# Patient Record
Sex: Female | Born: 1955 | Race: White | Hispanic: No | Smoking: Former smoker
Health system: Southern US, Community
[De-identification: ages and names within clinical notes are randomized; demographics above are authoritative.]

## PROBLEM LIST (undated history)

## (undated) DIAGNOSIS — T7840XA Allergy, unspecified, initial encounter: Secondary | ICD-10-CM

## (undated) DIAGNOSIS — F17200 Nicotine dependence, unspecified, uncomplicated: Secondary | ICD-10-CM

## (undated) DIAGNOSIS — M545 Low back pain, unspecified: Secondary | ICD-10-CM

## (undated) DIAGNOSIS — C801 Malignant (primary) neoplasm, unspecified: Secondary | ICD-10-CM

## (undated) DIAGNOSIS — J309 Allergic rhinitis, unspecified: Secondary | ICD-10-CM

---
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
## 2018-10-24 ENCOUNTER — Other Ambulatory Visit: Payer: Self-pay | Admitting: Radiation Oncology

## 2018-11-06 ENCOUNTER — Telehealth: Payer: Self-pay | Admitting: *Deleted

## 2018-11-06 NOTE — Telephone Encounter (Addendum)
Oncology Nurse Navigator Documentation  Received call from patient's wife in follow-up to call from [place] earlier today.  She works at [employer]. She indicated:  She will bring disc with recent imaging to [facility] tomorrow afternoon.  If PEG needed, he will have placement through [place].  Husband has dental policy that should cover pre-radiotherapy evaluation and related procedures. I explained appt with Dr. Isidore Moos to be scheduled s/p PET for which appt pending. I encouraged her to call me with additional questions.   Gayleen Orem, RN, BSN Head & Neck Oncology Nurse [place] at [place] [phone]

## 2018-11-06 NOTE — Telephone Encounter (Addendum)
Oncology Nurse Navigator Documentation  Rec'd call from Blanchard Kelch, Case Manager, [facility] [phone] x [phone]). 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  Imaging conducted 7/24 CT Neck, 7/31 Chest Xray.  Authorization for services to include PET.  Bx 8/5 [facility], pathologist Dr. Deliah Boston [phone] x [phone]), e-mail  [email or web address].  She is faxing to my attention reports for imaging, bx.  With pt's permission, she is providing my contact info to pt wife who is going to call me.  Gayleen Orem, RN, BSN Head & Neck Oncology Nurse [place] [facility] at [place] [phone]

## 2018-11-07 ENCOUNTER — Encounter: Payer: Self-pay | Admitting: *Deleted

## 2018-11-07 ENCOUNTER — Other Ambulatory Visit: Payer: Self-pay | Admitting: *Deleted

## 2018-11-07 DIAGNOSIS — C329 Malignant neoplasm of larynx, unspecified: Secondary | ICD-10-CM

## 2018-11-07 NOTE — Progress Notes (Signed)
Oncology Nurse Navigator Documentation  Met with pt's wife [facility] lobby, received disc with recent imaging.  Provided information for husband's PET scheduled 8/26, arrival to [facility] 0645, reviewed NPO/low carb guidelines.  She voiced understanding.  Gayleen Orem, RN, BSN Head & Neck Oncology Nurse [place] at [place] [phone]

## 2018-11-11 ENCOUNTER — Ambulatory Visit
Admission: RE | Admit: 2018-11-11 | Discharge: 2018-11-11 | Disposition: A | Discharge status: Home / Self Care | Payer: Self-pay | Source: Ambulatory Visit | Attending: Radiation Oncology | Admitting: Radiation Oncology

## 2018-11-11 ENCOUNTER — Other Ambulatory Visit (HOSPITAL_COMMUNITY): Payer: Self-pay | Admitting: Radiation Oncology

## 2018-11-11 DIAGNOSIS — C801 Malignant (primary) neoplasm, unspecified: Secondary | ICD-10-CM

## 2018-11-12 ENCOUNTER — Ambulatory Visit: Payer: Non-veteran care | Admitting: Radiation Oncology

## 2018-11-13 ENCOUNTER — Encounter: Payer: Self-pay | Admitting: Radiation Oncology

## 2018-11-13 ENCOUNTER — Other Ambulatory Visit: Payer: Self-pay

## 2018-11-13 ENCOUNTER — Telehealth: Payer: Self-pay | Admitting: *Deleted

## 2018-11-13 ENCOUNTER — Encounter (HOSPITAL_COMMUNITY)
Admission: RE | Admit: 2018-11-13 | Discharge: 2018-11-13 | Disposition: A | Discharge status: Home / Self Care | Payer: No Typology Code available for payment source | Source: Ambulatory Visit | Attending: Radiation Oncology | Admitting: Radiation Oncology

## 2018-11-13 DIAGNOSIS — C329 Malignant neoplasm of larynx, unspecified: Secondary | ICD-10-CM | POA: Insufficient documentation

## 2018-11-13 LAB — GLUCOSE, CAPILLARY: Glucose-Capillary: 95 mg/dL (ref 70–99)

## 2018-11-13 MED ORDER — FLUDEOXYGLUCOSE F - 18 (FDG) INJECTION
7.2000 | Freq: Once | INTRAVENOUS | Status: AC | PRN
  Administered 2018-11-13: 07:00:00 7.2 via INTRAVENOUS

## 2018-11-13 NOTE — Progress Notes (Signed)
Head and Neck Cancer Location of Tumor / Histology:  10/23/18   Patient presented with symptoms of: Hoarseness for one year  Biopsies of left vocal cord, left anterior commissure mass, left anterior true vocal cord, left true vocal cord revealed: Invasive squamous cell carcinoma.   Nutrition Status Yes No Comments  Weight changes? []  [x]    Swallowing concerns? []  [x]    PEG? []  [x]     Referrals Yes No Comments  Social Work? []  [x]    Dentistry? [x]  [x]    Swallowing therapy? []  [x]    Nutrition? []  [x]    Med/Onc? []  [x]     Safety Issues Yes No Comments  Prior radiation? []  [x]    Pacemaker/ICD? []  [x]    Possible current pregnancy? []  [x]    Is the patient on methotrexate? []  [x]     Tobacco/Marijuana/Snuff/ETOH use: He has a history of cigarette and chewing tobacco use.    Past/Anticipated interventions by otolaryngology, if any:  [facility]  Past/Anticipated interventions by medical oncology, if any:  None   Current Complaints / other details:   11/13/18 PET

## 2018-11-15 ENCOUNTER — Telehealth: Payer: Self-pay | Admitting: Radiation Oncology

## 2018-11-18 ENCOUNTER — Other Ambulatory Visit: Payer: Self-pay

## 2018-11-18 ENCOUNTER — Encounter: Payer: Self-pay | Admitting: Radiation Oncology

## 2018-11-18 ENCOUNTER — Ambulatory Visit
Admission: RE | Admit: 2018-11-18 | Discharge: 2018-11-18 | Disposition: A | Discharge status: Home / Self Care | Payer: Non-veteran care | Source: Ambulatory Visit | Attending: Radiation Oncology | Admitting: Radiation Oncology

## 2018-11-18 DIAGNOSIS — C320 Malignant neoplasm of glottis: Secondary | ICD-10-CM

## 2018-11-18 HISTORY — DX: Allergic rhinitis, unspecified: J30.9

## 2018-11-18 HISTORY — DX: Allergy, unspecified, initial encounter: T78.40XA

## 2018-11-18 HISTORY — DX: Low back pain, unspecified: M54.50

## 2018-11-18 HISTORY — DX: Malignant (primary) neoplasm, unspecified: C80.1

## 2018-11-18 HISTORY — DX: Nicotine dependence, unspecified, uncomplicated: F17.200

## 2018-11-18 NOTE — Progress Notes (Signed)
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 adult who presented with hoarseness.  Subsequently, the patient saw Dr. Silvestre Moment who appreciated a left glottic lesion that involves the anterior commissure and ventricle, clinical stage T2 N0.  I have personally discussed this case with Dr. Edison Nasuti to confirm her impression, laryngoscopy observations, and stage.  Biopsy of that lesion revealed invasive squamous cell carcinoma, well to moderately differentiated.  Pertinent imaging thus far includes CT scan of neck revealing a 7 mm right upper lobe pulmonary nodule that was indeterminate, neck was unremarkable.  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   Nutrition Status Yes No Comments  Weight changes? []  [x]    Swallowing concerns? []  [x]    PEG? []  [x]     Referrals Yes No Comments  Social Work? []  [x]    Dentistry? [x]  [x]    Swallowing therapy? []  [x]    Nutrition? []  [x]    Med/Onc? []  [x]      Safety Issues Yes No Comments  Prior radiation? []  [x]    Pacemaker/ICD? []  [x]    Possible current pregnancy? []  [x]    Is the patient on methotrexate? []  [x]     Tobacco/Marijuana/Snuff/ETOH use: He has a history of cigarette and chewing tobacco use.  He quit after diagnosis and is using nicotine patches.  Past/Anticipated interventions by otolaryngology, if any:  [facility]  Past/Anticipated interventions by medical oncology, if any:  None    PREVIOUS RADIATION THERAPY: No  PAST MEDICAL HISTORY:  has a past medical history of Allergic rhinitis, Allergy, Cancer ([place]), Low back pain, and Nicotine dependence.    PAST SURGICAL HISTORY:History reviewed. No pertinent surgical history.  FAMILY HISTORY: family history is not on file.  SOCIAL HISTORY:  reports that he has quit smoking. He quit smokeless tobacco use about 8 months ago. He reports current alcohol use of about 14.0 standard drinks of alcohol per week. He reports that he does not use drugs.  ALLERGIES: Patient has no known allergies.  MEDICATIONS:  Current Outpatient Medications  Medication Sig Dispense Refill  . cetirizine (ZYRTEC) 10 MG tablet Take 10 mg by mouth daily.    . fluticasone (FLONASE) 50 MCG/ACT nasal spray Place into both nostrils daily.    . nicotine (NICODERM CQ - DOSED IN MG/24 HOURS) 14 mg/24hr patch Place 14 mg onto the skin daily.    Marland Kitchen omeprazole (PRILOSEC) 40 MG capsule Take 40 mg by mouth daily.    . nicotine (NICODERM CQ - DOSED  IN MG/24 HOURS) 21 mg/24hr patch Place 21 mg onto the skin daily.    . nicotine (NICODERM CQ - DOSED IN MG/24 HR) 7 mg/24hr patch Place 7 mg onto the skin daily.     No current facility-administered medications for this encounter.     REVIEW OF SYSTEMS:  Notable for that above.   PHYSICAL EXAM:  vitals were not taken for this visit.   General: Alert and oriented, in no acute distress.   Psychiatric: Judgment and insight are intact. Affect is appropriate.     LABORATORY DATA:  No results found for: WBC, HGB, HCT, MCV, PLT CMP  No results found for: NA, K, CL, CO2, GLUCOSE, BUN, CREATININE, CALCIUM, PROT, ALBUMIN, AST, ALT, ALKPHOS, BILITOT, GFRNONAA, GFRAA    No results found for: TSH   RADIOGRAPHY: Nm Pet Image Initial (pi) Skull Base To Thigh  Result Date: 11/13/2018 CLINICAL DATA:  Initial treatment strategy for residual cancer. EXAM: NUCLEAR MEDICINE PET SKULL BASE TO THIGH TECHNIQUE: 7.2 mCi F-18 FDG was injected intravenously. Full-ring PET imaging was performed from the skull base to thigh after the radiotracer. CT data was obtained and used for attenuation correction and anatomic localization. Fasting blood glucose: 95 mg/dl COMPARISON:  Outside CT neck 10/11/2018. FINDINGS: Mediastinal blood pool activity: SUV max 2.5 Liver activity: SUV max NA NECK: No hypermetabolic lymph nodes. Uptake within the oropharynx appears symmetric. Incidental CT findings: None. CHEST: No hypermetabolic mediastinal hilar or axillary nodes. No hypermetabolic pulmonary nodules. Incidental CT findings: Heart is mildly enlarged. No pericardial or pleural effusion. There may be distal esophageal wall thickening which can be seen with gastroesophageal reflux. ABDOMEN/PELVIS: No abnormal hypermetabolism in the liver, adrenal glands, spleen or pancreas. No hypermetabolic lymph nodes. Incidental CT findings: Liver may be slightly decreased in attenuation diffusely. A stone is seen in the gallbladder. Adrenal glands, kidneys, spleen, pancreas, stomach and bowel are unremarkable. No free fluid. SKELETON: No abnormal osseous hypermetabolism. Incidental CT findings: None. IMPRESSION: 1. No evidence of a hypermetabolic primary lesion or adenopathy in the neck. 2. Hepatic steatosis. 3. Cholelithiasis. Electronically Signed   By: Lorin Picket M.D.   On: 11/13/2018 09:06      IMPRESSION/PLAN:  This is a delightful patient with head and neck cancer.  He has discussed surgical options  with Dr. Edison Nasuti.  He would like alternatives.  Therefore we talked about definitive radiation therapy to the larynx today which would take place over 6 weeks.  We discussed the potential risks, benefits, and side effects of radiotherapy. We talked in detail about acute and late effects. We discussed that some of the most bothersome acute effects may be mucositis, salivary changes, hoarseness, skin irritation, hair loss, dehydration, weight loss and fatigue. We talked about late effects which include but are not necessarily limited to dysphagia, hypothyroidism, permanent injury to the larynx, permanent skin changes. No guarantees of treatment were given. The patient is enthusiastic about proceeding with treatment. I look forward to participating in the patient's care.    Simulation (treatment planning) will take place tomorrow  We also discussed that the treatment of head and neck cancer is a multidisciplinary process to maximize treatment outcomes and quality of life. For this reason the following referrals have been or will be made:  He has been set up with dentistry at the [facility].  I do not anticipate any significant dose to his mouth   Nutritionist for nutrition support during and after treatment.   Speech language pathology for swallowing  and/or speech therapy.   Social work for social support.    Baseline labs including TSH.  This encounter was provided by telemedicine platform phone as he was not able to access WebEx.  The patient has given verbal consent for this type of encounter and has been advised to only accept a meeting of this type in a secure network environment. The time spent during this encounter was 28 minutes. The attendants for this meeting include Eppie Gibson  and Festus Aloe.  His wife was present too. During the encounter, Eppie Gibson was located at [facility] Radiation Oncology Department.  Festus Aloe was located at home.    __________________________________________   Eppie Gibson, MD

## 2018-11-19 ENCOUNTER — Other Ambulatory Visit: Payer: Self-pay | Admitting: Radiation Oncology

## 2018-11-19 ENCOUNTER — Telehealth: Payer: Self-pay | Admitting: *Deleted

## 2018-11-19 ENCOUNTER — Ambulatory Visit
Admission: RE | Admit: 2018-11-19 | Discharge: 2018-11-19 | Disposition: A | Discharge status: Home / Self Care | Payer: No Typology Code available for payment source | Source: Ambulatory Visit | Attending: Radiation Oncology | Admitting: Radiation Oncology

## 2018-11-19 ENCOUNTER — Other Ambulatory Visit: Payer: Self-pay

## 2018-11-19 ENCOUNTER — Encounter: Payer: Self-pay | Admitting: Radiation Oncology

## 2018-11-19 DIAGNOSIS — Z1329 Encounter for screening for other suspected endocrine disorder: Secondary | ICD-10-CM

## 2018-11-19 DIAGNOSIS — Z510 Encounter for antineoplastic radiation therapy: Secondary | ICD-10-CM | POA: Insufficient documentation

## 2018-11-19 DIAGNOSIS — C320 Malignant neoplasm of glottis: Secondary | ICD-10-CM | POA: Diagnosis not present

## 2018-11-19 DIAGNOSIS — R1310 Dysphagia, unspecified: Secondary | ICD-10-CM | POA: Diagnosis present

## 2018-11-19 DIAGNOSIS — R491 Aphonia: Secondary | ICD-10-CM | POA: Diagnosis present

## 2018-11-19 NOTE — Progress Notes (Signed)
  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  Identity was confirmed.  All relevant records and images related to the planned course of therapy were reviewed.  The patient freely provided informed written consent to proceed with treatment after reviewing the details related to the planned course of therapy. The consent form was witnessed and verified by the simulation staff.  No palpable neck masses, no oropharyngeal masses on exam.  Wife present on speaker phone for the discussion today.  Then, the patient was set-up in a stable reproducible supine position for radiation therapy.  Aquaplast head and should mask was custom fabricated for immobilization.  Custom bolus placed. CT images were obtained without contrast.  Surface markings were placed.  The CT images were loaded into the planning software.    TREATMENT PLANNING NOTE: Treatment planning then occurred.  The radiation prescription was entered and confirmed.    A total of 3 medically necessary complex treatment devices were fabricated and supervised by me (2 wedges for the opposed fields and the Aquaplast head and shoulder mask). I have requested : 3D Simulation  I have requested a DVH of the following structures: target volume, esophagus, spinal cord.  I have ordered:Nutrition Consult  The patient will receive 65.25 Gy in 29 fractions to the larynx with opposed lateral fields.  -----------------------------------  Eppie Gibson, MD

## 2018-11-19 NOTE — Telephone Encounter (Signed)
CALLED PATIENT TO INFORM OF LAB APPT. FOR 11-26-18 @ 8:30 AM , SPOKE WITH PATIENT AND HE IS AWARE OF THIS LAB(LAB TO BE @ [place]), SPOKE WITH PATIENT AND HE IS AWARE OF THIS APPT.

## 2018-11-20 ENCOUNTER — Telehealth: Payer: Self-pay | Admitting: Nutrition

## 2018-11-20 NOTE — Telephone Encounter (Signed)
Scheduled appt per 9/1 sch message - pt is aware of appt .

## 2018-11-22 ENCOUNTER — Other Ambulatory Visit: Payer: Self-pay | Admitting: Radiation Oncology

## 2018-11-22 DIAGNOSIS — Z510 Encounter for antineoplastic radiation therapy: Secondary | ICD-10-CM | POA: Diagnosis not present

## 2018-11-22 DIAGNOSIS — C320 Malignant neoplasm of glottis: Secondary | ICD-10-CM

## 2018-11-22 MED ORDER — LORAZEPAM 0.5 MG PO TABS: ORAL_TABLET | ORAL | 0 refills | Status: DC

## 2018-11-26 ENCOUNTER — Ambulatory Visit
Admission: RE | Admit: 2018-11-26 | Discharge: 2018-11-26 | Disposition: A | Discharge status: Home / Self Care | Payer: No Typology Code available for payment source | Source: Ambulatory Visit | Attending: Radiation Oncology | Admitting: Radiation Oncology

## 2018-11-26 ENCOUNTER — Other Ambulatory Visit: Payer: Self-pay

## 2018-11-26 DIAGNOSIS — C320 Malignant neoplasm of glottis: Secondary | ICD-10-CM

## 2018-11-26 DIAGNOSIS — Z1329 Encounter for screening for other suspected endocrine disorder: Secondary | ICD-10-CM

## 2018-11-26 DIAGNOSIS — Z510 Encounter for antineoplastic radiation therapy: Secondary | ICD-10-CM | POA: Diagnosis not present

## 2018-11-26 LAB — BASIC METABOLIC PANEL
Anion gap: 8 (ref 5–15)
BUN: 17 mg/dL (ref 8–23)
CO2: 25 mmol/L (ref 22–32)
Calcium: 9.3 mg/dL (ref 8.9–10.3)
Chloride: 107 mmol/L (ref 98–111)
Creatinine, Ser: 1.04 mg/dL — ABNORMAL HIGH (ref 0.44–1.00)
GFR calc Af Amer: >60 mL/min (ref >60)
GFR calc non Af Amer: 58 mL/min — ABNORMAL LOW (ref >60)
Glucose, Bld: 82 mg/dL (ref 70–99)
Potassium: 4.4 mmol/L (ref 3.5–5.1)
Sodium: 140 mmol/L (ref 135–145)

## 2018-11-26 LAB — TSH: TSH: 2.793 u[IU]/mL (ref 0.308–3.960)

## 2018-11-27 ENCOUNTER — Ambulatory Visit
Admission: RE | Admit: 2018-11-27 | Discharge: 2018-11-27 | Disposition: A | Discharge status: Home / Self Care | Payer: No Typology Code available for payment source | Source: Ambulatory Visit | Attending: Radiation Oncology | Admitting: Radiation Oncology

## 2018-11-27 ENCOUNTER — Other Ambulatory Visit: Payer: Self-pay

## 2018-11-27 DIAGNOSIS — Z510 Encounter for antineoplastic radiation therapy: Secondary | ICD-10-CM | POA: Diagnosis not present

## 2018-11-28 ENCOUNTER — Ambulatory Visit
Admission: RE | Admit: 2018-11-28 | Discharge: 2018-11-28 | Disposition: A | Discharge status: Home / Self Care | Payer: No Typology Code available for payment source | Source: Ambulatory Visit | Attending: Radiation Oncology | Admitting: Radiation Oncology

## 2018-11-28 DIAGNOSIS — Z510 Encounter for antineoplastic radiation therapy: Secondary | ICD-10-CM | POA: Diagnosis not present

## 2018-11-29 ENCOUNTER — Other Ambulatory Visit: Payer: Self-pay

## 2018-11-29 ENCOUNTER — Ambulatory Visit
Admission: RE | Admit: 2018-11-29 | Discharge: 2018-11-29 | Disposition: A | Discharge status: Home / Self Care | Payer: No Typology Code available for payment source | Source: Ambulatory Visit | Attending: Radiation Oncology | Admitting: Radiation Oncology

## 2018-11-29 DIAGNOSIS — Z510 Encounter for antineoplastic radiation therapy: Secondary | ICD-10-CM | POA: Diagnosis not present

## 2018-12-02 ENCOUNTER — Ambulatory Visit
Admission: RE | Admit: 2018-12-02 | Discharge: 2018-12-02 | Disposition: A | Discharge status: Home / Self Care | Payer: No Typology Code available for payment source | Source: Ambulatory Visit | Attending: Radiation Oncology | Admitting: Radiation Oncology

## 2018-12-02 ENCOUNTER — Other Ambulatory Visit: Payer: Self-pay

## 2018-12-02 ENCOUNTER — Other Ambulatory Visit: Payer: Self-pay | Admitting: Radiation Oncology

## 2018-12-02 DIAGNOSIS — C320 Malignant neoplasm of glottis: Secondary | ICD-10-CM

## 2018-12-02 DIAGNOSIS — Z510 Encounter for antineoplastic radiation therapy: Secondary | ICD-10-CM | POA: Diagnosis not present

## 2018-12-02 MED ORDER — LIDOCAINE VISCOUS HCL 2 % MT SOLN: OROMUCOSAL | 5 refills | Status: DC

## 2018-12-02 MED ORDER — SONAFINE EX EMUL
1.0000 "application " | Freq: Once | CUTANEOUS | Status: AC
  Administered 2018-12-02: 1 via TOPICAL

## 2018-12-02 MED ORDER — LORAZEPAM 0.5 MG PO TABS: ORAL_TABLET | ORAL | 0 refills | Status: DC

## 2018-12-02 NOTE — Progress Notes (Signed)

## 2018-12-03 ENCOUNTER — Other Ambulatory Visit: Payer: Self-pay

## 2018-12-03 ENCOUNTER — Inpatient Hospital Stay: Payer: Non-veteran care | Attending: Radiation Oncology | Admitting: Nutrition

## 2018-12-03 ENCOUNTER — Encounter: Payer: Self-pay | Admitting: *Deleted

## 2018-12-03 ENCOUNTER — Ambulatory Visit
Admission: RE | Admit: 2018-12-03 | Discharge: 2018-12-03 | Disposition: A | Discharge status: Home / Self Care | Payer: No Typology Code available for payment source | Source: Ambulatory Visit | Attending: Radiation Oncology | Admitting: Radiation Oncology

## 2018-12-03 DIAGNOSIS — Z510 Encounter for antineoplastic radiation therapy: Secondary | ICD-10-CM | POA: Diagnosis not present

## 2018-12-03 NOTE — Progress Notes (Signed)
Oncology Nurse Navigator Documentation  Delivered FMLA forms for patient's wife and dtr rec'd 11/27/18 to Corky Crafts with guidance to return to me when completed for delivery to patient.  Gayleen Orem, RN, BSN Head & Neck Oncology Nurse [place] at [place] [phone]

## 2018-12-03 NOTE — Progress Notes (Signed)
63 year old female diagnosed with glottis cancer receiving radiation therapy.  Past medical history includes nicotine and low back pain.  Medications include Prilosec  Labs were reviewed.  Height: 65 inches per patient. Weight: 153 pounds September 14 per patient. Usual body weight: 138-142 pounds per patient.  Patient reports good oral intake and recent 10 pound weight gain secondary to upcoming treatment. Reports he does not feel good at this weight and really wants to go back to his usual weight. No nutrition issues currently.  Nutrition diagnosis:  Predicted suboptimal energy intake related to glottis cancer and associated treatments as evidenced by a history of presence of a condition for which research shows an increased incidence of suboptimal energy intake.  Intervention: Educated patient on strategies for adequate calorie and protein intake for weight maintenance. Encouraged high-protein foods. Discussed soft diet when swallowing becomes painful or difficult. Provided fact sheets and contact information. Patient agreeable to weekly follow-up.  Monitoring, evaluation, goals: Patient will tolerate adequate calories and protein to minimize weight loss throughout treatment.  Next visit: Monday, September 21 after treatment.  **Disclaimer: This note was dictated with voice recognition software. Similar sounding words can inadvertently be transcribed and this note may contain transcription errors which may not have been corrected upon publication of note.**

## 2018-12-04 ENCOUNTER — Other Ambulatory Visit: Payer: Self-pay

## 2018-12-04 ENCOUNTER — Ambulatory Visit
Admission: RE | Admit: 2018-12-04 | Discharge: 2018-12-04 | Disposition: A | Discharge status: Home / Self Care | Payer: No Typology Code available for payment source | Source: Ambulatory Visit | Attending: Radiation Oncology | Admitting: Radiation Oncology

## 2018-12-04 DIAGNOSIS — Z510 Encounter for antineoplastic radiation therapy: Secondary | ICD-10-CM | POA: Diagnosis not present

## 2018-12-05 ENCOUNTER — Other Ambulatory Visit: Payer: Self-pay

## 2018-12-05 ENCOUNTER — Telehealth: Payer: Self-pay | Admitting: *Deleted

## 2018-12-05 ENCOUNTER — Ambulatory Visit
Admission: RE | Admit: 2018-12-05 | Discharge: 2018-12-05 | Disposition: A | Discharge status: Home / Self Care | Payer: No Typology Code available for payment source | Source: Ambulatory Visit | Attending: Radiation Oncology | Admitting: Radiation Oncology

## 2018-12-05 DIAGNOSIS — Z510 Encounter for antineoplastic radiation therapy: Secondary | ICD-10-CM | POA: Diagnosis not present

## 2018-12-05 NOTE — Telephone Encounter (Signed)
On 12-04-18 fax[place] [facility]

## 2018-12-06 ENCOUNTER — Other Ambulatory Visit: Payer: Self-pay

## 2018-12-06 ENCOUNTER — Ambulatory Visit
Admission: RE | Admit: 2018-12-06 | Discharge: 2018-12-06 | Disposition: A | Discharge status: Home / Self Care | Payer: No Typology Code available for payment source | Source: Ambulatory Visit | Attending: Radiation Oncology | Admitting: Radiation Oncology

## 2018-12-06 DIAGNOSIS — Z510 Encounter for antineoplastic radiation therapy: Secondary | ICD-10-CM | POA: Diagnosis not present

## 2018-12-09 ENCOUNTER — Telehealth: Payer: Self-pay | Admitting: *Deleted

## 2018-12-09 ENCOUNTER — Other Ambulatory Visit: Payer: Self-pay

## 2018-12-09 ENCOUNTER — Inpatient Hospital Stay: Payer: Non-veteran care | Admitting: Nutrition

## 2018-12-09 ENCOUNTER — Ambulatory Visit
Admission: RE | Admit: 2018-12-09 | Discharge: 2018-12-09 | Disposition: A | Discharge status: Home / Self Care | Payer: No Typology Code available for payment source | Source: Ambulatory Visit | Attending: Radiation Oncology | Admitting: Radiation Oncology

## 2018-12-09 DIAGNOSIS — Z510 Encounter for antineoplastic radiation therapy: Secondary | ICD-10-CM | POA: Diagnosis not present

## 2018-12-09 NOTE — Telephone Encounter (Signed)
A user error has taken place: encounter opened in error, closed for administrative reasons.

## 2018-12-09 NOTE — Progress Notes (Signed)
Nutrition follow-up completed with patient over the telephone.  Patient is receiving radiation therapy for glottis cancer. Patient reports he feels well. Reports he has gained 3 pounds but really needs to lose weight. He denies nutrition impact symptoms.  Nutrition diagnosis:  Predicted suboptimal energy intake continues.  Intervention: Educated patient to continue strategies for adequate calories and protein for weight maintenance. Discouraged weight loss during treatment. Patient is agreeable to telephone follow-ups once weekly to check in with him.  Monitoring, evaluation, goals: Patient will tolerate adequate calories and protein to minimize weight loss throughout treatment.  Next visit: Tuesday, September 29 via telephone.  **Disclaimer: This note was dictated with voice recognition software. Similar sounding words can inadvertently be transcribed and this note may contain transcription errors which may not have been corrected upon publication of note.**

## 2018-12-09 NOTE — Telephone Encounter (Signed)
Oncology Nurse Navigator Documentation  Spoke with Alicia Vazquez, confirmed arrival for [facility] tomorrow morning, 8:50 for lobby registration, 9:15 MDC.  He voiced understanding he will proceed to scheduled RT following MDC.  Gayleen Orem, RN, BSN Head & Neck Oncology Nurse [place] at [place] [phone]

## 2018-12-10 ENCOUNTER — Ambulatory Visit
Admission: RE | Admit: 2018-12-10 | Discharge: 2018-12-10 | Disposition: A | Discharge status: Home / Self Care | Payer: No Typology Code available for payment source | Source: Ambulatory Visit | Attending: Radiation Oncology | Admitting: Radiation Oncology

## 2018-12-10 ENCOUNTER — Other Ambulatory Visit: Payer: Self-pay

## 2018-12-10 ENCOUNTER — Encounter: Payer: Self-pay | Admitting: *Deleted

## 2018-12-10 ENCOUNTER — Ambulatory Visit: Payer: No Typology Code available for payment source | Attending: Radiation Oncology

## 2018-12-10 DIAGNOSIS — Z510 Encounter for antineoplastic radiation therapy: Secondary | ICD-10-CM | POA: Diagnosis not present

## 2018-12-10 DIAGNOSIS — R491 Aphonia: Secondary | ICD-10-CM

## 2018-12-10 DIAGNOSIS — R1310 Dysphagia, unspecified: Secondary | ICD-10-CM

## 2018-12-10 NOTE — Progress Notes (Signed)
Oncology Nurse Navigator Documentation  Met with Mr. Jensen upon his arrival for H&N [place].    Provided verbal and written overview of [place], encouraged him to ask questions.  He was seen by SLP Garald Balding. I provided him FMLA paperwork for his wife and daughter per previous request.  He proceeded to XRT s/p MDC.  Gayleen Orem, RN, BSN Head & Neck Oncology [place] at [place] [phone]

## 2018-12-10 NOTE — Therapy (Signed)
[facility] [address] [place], [place], [address] Phone: [phone]   Fax:  [phone]  Speech Language Pathology Evaluation  Patient Details  Name: Alicia Vazquez MRN: [id number] Date of Birth: 1955/12/15 Referring Provider (SLP): Eppie Gibson, MD   Encounter Date: 12/10/2018  End of Session - 12/10/18 1221    Visit Number  1    Number of Visits  7    Date for SLP Re-Evaluation  06/09/19    SLP Start Time  15    SLP Stop Time   1000    SLP Time Calculation (min)  45 min    Activity Tolerance  Patient tolerated treatment well       Past Medical History:  Diagnosis Date  . Allergic rhinitis   . Allergy   . Cancer [facility])    malignant neoplasm of larynx dx 10/2018  . Low back pain   . Nicotine dependence     History reviewed. No pertinent surgical history.  There were no vitals filed for this visit.  Subjective Assessment - 12/10/18 1211    Subjective  "it's in my voice box." (pt, re: his cancer) Pt is aphonic.    Currently in Pain?  No/denies         SLP Evaluation [facility] - 12/10/18 1211      SLP Visit Information   SLP Received On  12/10/18    Referring Provider (SLP)  Eppie Gibson, MD    Medical Diagnosis  glottic ISCC      Subjective   Patient/Family Stated Goal  "I want to keep my swallowing just fine"      General Information   HPI  Presented to PCP with hoarseness and eventually to Dr. Silvestre Moment at [facility] who visualized lt glottic lesion including anterior commisure and ventricle. Pt began rad tx on 11-27-18      Oral Motor/Sensory Function   Overall Oral Motor/Sensory Function  deferred assessment due to masking policy and presence of non-oral/glottic CA       Pt currently tolerates regular diet with thin liquids, rare intermittent throat clearing - WFL/WNL. Thyroid elevation appeared WNL, and swallows appeared timely.  Because data states the risk for dysphagia during and after  radiation treatment is high due to undergoing radiation tx, SLP taught pt about the possibility of reduced/limited ability for PO intake during rad tx. SLP encouraged pt to continue swallowing POs as far into rad tx as possible, even ingesting POs and/or completing HEP shortly after administration of pain meds.   SLP educated pt re: changes to swallowing musculature after rad tx, and why adherence to dysphagia HEP provided today and PO consumption was necessary to inhibit muscle fibrosis following rad tx. Pt demonstrated understanding of these things to SLP.    SLP then developed a HEP for pt and pt was instructed how to perform exercises involving lingual, vocal, and pharyngeal strengthening. SLP performed each exercise and pt return demonstrated each exercise. SLP ensured pt performance was correct prior to moving on to next exercise. Pt was instructed to complete this program 2 times a day until 6 months after his last rad tx, then x2 a week after that.                 SLP Education - 12/10/18 1220    Education Details  HEP procedure, late effects head/neck CA on swallowing ability, nature of vocal fold movement can be affected by radiation    Person(s) Educated  Patient    Methods  Explanation;Demonstration;Verbal cues;Handout    Comprehension  Need further instruction;Verbal cues required;Verbalized understanding;Returned demonstration       SLP Short Term Goals - 12/10/18 1301      SLP SHORT TERM GOAL #1   Title  pt will tell SLP why he is completing HEP over two sessions    Time  2    Period  --   visits, for all STGs   Status  New      SLP SHORT TERM GOAL #2   Title  pt will demo HEP for dysphagia with rare min A    Time  2    Status  New      SLP SHORT TERM GOAL #3   Title  pt will demo knowledge of 3 overt s/s of aspiratinon PNA    Time  3    Period  --   visits   Status  New       SLP Long Term Goals - 12/10/18 1303      SLP LONG TERM GOAL #1   Title   pt will demo HEP with modified independence in 3 visits    Time  5    Period  --   visits, for all LTGs   Status  New      SLP LONG TERM GOAL #2   Title  pt will tell when to decr frequency of HEP over two visits    Time  7    Status  New       Plan - 12/10/18 1254    Clinical Impression Statement  Pt presents today with swallowing essentially WNL for regular/thin, with rare and inconsistent throat clears with [place] sandwich and thin. Pt without overt s/sx aspiration PNA. Data state swallowing ability decreases during radiation therapy to the head and neck as well as afterwards, as risk of muscle fibrosis increases following rad tx. Pt would cont to benefit from skilled ST to assess regularly pt's procedure with HEP as well as safety with his current POs.    Speech Therapy Frequency  --   approx once every 4 weeks   Duration  --   7 total visits   Treatment/Interventions  Aspiration precaution training;Pharyngeal strengthening exercises;Diet toleration management by SLP;Cueing hierarchy;Trials of upgraded texture/liquids;Compensatory techniques;Patient/family education;SLP instruction and feedback    Potential to [place]  provided today    Consulted and Agree with Plan of Care  Patient       Patient will benefit from skilled therapeutic intervention in order to improve the following deficits and impairments:   Dysphagia, unspecified type  Aphonia    Problem List Patient Active Problem List   Diagnosis Date Noted  . 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

## 2018-12-10 NOTE — Patient Instructions (Signed)
SWALLOWING EXERCISES Do these  until 6 months after your last day of radiation, then 2 times per week afterwards  1. Effortful Swallows - Press your tongue against the roof of your mouth for 3 seconds, then squeeze          the muscles in your neck while you swallow your saliva or a sip of water - Repeat 10-15 times, 2 times a day, and use whenever you eat or drink  your mouth gets dry*  2. Mendelsohn Maneuver - "half swallow" exercise - Start to swallow, and keep your Adam's apple up by squeezing hard with the            muscles of the throat - Hold the squeeze for 5-7 seconds and then relax - Repeat 20 times, 2-3 times a day *use a wet spoon if your mouth gets dry*  3. Super swallow  Take a breath and hold it  Bear down  Swallow and cough   -repeat 10 times twice a day  4. "Siren" exercise  Start at as low of a pitch as you can and glide up to as high of a pitch as you can, then back down again - repeat this for 2-3 seconds  -repeat 10 times twice a day

## 2018-12-11 ENCOUNTER — Ambulatory Visit
Admission: RE | Admit: 2018-12-11 | Discharge: 2018-12-11 | Disposition: A | Discharge status: Home / Self Care | Payer: No Typology Code available for payment source | Source: Ambulatory Visit | Attending: Radiation Oncology | Admitting: Radiation Oncology

## 2018-12-11 ENCOUNTER — Other Ambulatory Visit: Payer: Self-pay

## 2018-12-11 DIAGNOSIS — Z510 Encounter for antineoplastic radiation therapy: Secondary | ICD-10-CM | POA: Diagnosis not present

## 2018-12-12 ENCOUNTER — Other Ambulatory Visit: Payer: Self-pay

## 2018-12-12 ENCOUNTER — Ambulatory Visit
Admission: RE | Admit: 2018-12-12 | Discharge: 2018-12-12 | Disposition: A | Discharge status: Home / Self Care | Payer: No Typology Code available for payment source | Source: Ambulatory Visit | Attending: Radiation Oncology | Admitting: Radiation Oncology

## 2018-12-12 DIAGNOSIS — Z510 Encounter for antineoplastic radiation therapy: Secondary | ICD-10-CM | POA: Diagnosis not present

## 2018-12-13 ENCOUNTER — Ambulatory Visit
Admission: RE | Admit: 2018-12-13 | Discharge: 2018-12-13 | Disposition: A | Discharge status: Home / Self Care | Payer: No Typology Code available for payment source | Source: Ambulatory Visit | Attending: Radiation Oncology | Admitting: Radiation Oncology

## 2018-12-13 ENCOUNTER — Other Ambulatory Visit: Payer: Self-pay

## 2018-12-13 DIAGNOSIS — Z510 Encounter for antineoplastic radiation therapy: Secondary | ICD-10-CM | POA: Diagnosis not present

## 2018-12-16 ENCOUNTER — Telehealth: Payer: Self-pay | Admitting: Radiation Oncology

## 2018-12-16 ENCOUNTER — Ambulatory Visit
Admission: RE | Admit: 2018-12-16 | Discharge: 2018-12-16 | Disposition: A | Discharge status: Home / Self Care | Payer: No Typology Code available for payment source | Source: Ambulatory Visit | Attending: Radiation Oncology | Admitting: Radiation Oncology

## 2018-12-16 ENCOUNTER — Other Ambulatory Visit: Payer: Self-pay

## 2018-12-16 ENCOUNTER — Other Ambulatory Visit: Payer: Self-pay | Admitting: Radiation Oncology

## 2018-12-16 DIAGNOSIS — C320 Malignant neoplasm of glottis: Secondary | ICD-10-CM

## 2018-12-16 DIAGNOSIS — Z510 Encounter for antineoplastic radiation therapy: Secondary | ICD-10-CM | POA: Diagnosis not present

## 2018-12-16 MED ORDER — HYDROCODONE-ACETAMINOPHEN 7.5-325 MG/15ML PO SOLN: 10.0000 mL | Freq: Four times a day (QID) | ORAL | 0 refills | Status: DC | PRN

## 2018-12-16 MED ORDER — SUCRALFATE 1 G PO TABS: ORAL_TABLET | ORAL | 5 refills | Status: DC

## 2018-12-16 MED ORDER — LORAZEPAM 0.5 MG PO TABS: ORAL_TABLET | ORAL | 0 refills | Status: DC

## 2018-12-17 ENCOUNTER — Inpatient Hospital Stay: Payer: Non-veteran care | Admitting: Nutrition

## 2018-12-17 ENCOUNTER — Ambulatory Visit
Admission: RE | Admit: 2018-12-17 | Discharge: 2018-12-17 | Disposition: A | Discharge status: Home / Self Care | Payer: No Typology Code available for payment source | Source: Ambulatory Visit | Attending: Radiation Oncology | Admitting: Radiation Oncology

## 2018-12-17 ENCOUNTER — Other Ambulatory Visit: Payer: Self-pay

## 2018-12-17 DIAGNOSIS — Z510 Encounter for antineoplastic radiation therapy: Secondary | ICD-10-CM | POA: Diagnosis not present

## 2018-12-17 NOTE — Progress Notes (Signed)
RD working remotely.  Nutrition follow up completed with patient over the phone. Receiving radiation for Glottis cancer. He reports he is eating well. He feels well. Denies nutrition impact symptoms. Wt documented as 157.2 pounds on Sept 22.  Nutrition Diagnosis: Predicted sub optimal energy intake continues.  Intervention: Educated patient to continue strategies for increased calorie and protein for weight maintenance.  Monitoring, Evaluation, Goals: Tolerate adequate calories and protein to minimize weight loss.  Next Visit: Tuesday, Oct 6 over the phone.

## 2018-12-18 ENCOUNTER — Other Ambulatory Visit: Payer: Self-pay

## 2018-12-18 ENCOUNTER — Ambulatory Visit
Admission: RE | Admit: 2018-12-18 | Discharge: 2018-12-18 | Disposition: A | Discharge status: Home / Self Care | Payer: No Typology Code available for payment source | Source: Ambulatory Visit | Attending: Radiation Oncology | Admitting: Radiation Oncology

## 2018-12-18 DIAGNOSIS — Z510 Encounter for antineoplastic radiation therapy: Secondary | ICD-10-CM | POA: Diagnosis not present

## 2018-12-19 ENCOUNTER — Other Ambulatory Visit: Payer: Self-pay

## 2018-12-19 ENCOUNTER — Ambulatory Visit
Admission: RE | Admit: 2018-12-19 | Discharge: 2018-12-19 | Disposition: A | Discharge status: Home / Self Care | Payer: No Typology Code available for payment source | Source: Ambulatory Visit | Attending: Radiation Oncology | Admitting: Radiation Oncology

## 2018-12-19 DIAGNOSIS — Z510 Encounter for antineoplastic radiation therapy: Secondary | ICD-10-CM | POA: Insufficient documentation

## 2018-12-19 DIAGNOSIS — C320 Malignant neoplasm of glottis: Secondary | ICD-10-CM | POA: Insufficient documentation

## 2018-12-20 ENCOUNTER — Other Ambulatory Visit: Payer: Self-pay

## 2018-12-20 ENCOUNTER — Ambulatory Visit
Admission: RE | Admit: 2018-12-20 | Discharge: 2018-12-20 | Disposition: A | Discharge status: Home / Self Care | Payer: No Typology Code available for payment source | Source: Ambulatory Visit | Attending: Radiation Oncology | Admitting: Radiation Oncology

## 2018-12-20 DIAGNOSIS — Z510 Encounter for antineoplastic radiation therapy: Secondary | ICD-10-CM | POA: Diagnosis not present

## 2018-12-23 ENCOUNTER — Telehealth: Payer: Self-pay | Admitting: Radiation Oncology

## 2018-12-23 ENCOUNTER — Ambulatory Visit
Admission: RE | Admit: 2018-12-23 | Discharge: 2018-12-23 | Disposition: A | Discharge status: Home / Self Care | Payer: No Typology Code available for payment source | Source: Ambulatory Visit | Attending: Radiation Oncology | Admitting: Radiation Oncology

## 2018-12-23 ENCOUNTER — Other Ambulatory Visit: Payer: Self-pay

## 2018-12-23 DIAGNOSIS — Z510 Encounter for antineoplastic radiation therapy: Secondary | ICD-10-CM | POA: Diagnosis not present

## 2018-12-24 ENCOUNTER — Other Ambulatory Visit: Payer: Self-pay

## 2018-12-24 ENCOUNTER — Ambulatory Visit
Admission: RE | Admit: 2018-12-24 | Discharge: 2018-12-24 | Disposition: A | Discharge status: Home / Self Care | Payer: No Typology Code available for payment source | Source: Ambulatory Visit | Attending: Radiation Oncology | Admitting: Radiation Oncology

## 2018-12-24 ENCOUNTER — Inpatient Hospital Stay: Payer: Non-veteran care | Attending: Radiation Oncology | Admitting: Nutrition

## 2018-12-24 DIAGNOSIS — Z510 Encounter for antineoplastic radiation therapy: Secondary | ICD-10-CM | POA: Diagnosis not present

## 2018-12-24 NOTE — Progress Notes (Signed)
Nutrition follow-up completed with patient and wife over the telephone. He is receiving radiation for glottis cancer and his final treatment is scheduled for Monday, October 19. Patient reports his weight is stable between 153 and 156 pounds. This is above his usual body weight and he is concerned that he will not be able to lose weight after treatment. He is eating well and has been choosing softer foods.  Nutrition diagnosis: Predicted suboptimal energy intake continues.  Intervention: Educated patient to continue increased protein foods 5-6 times daily and consume adequate calories for weight maintenance. Encouraged activity as tolerated per physician.  Monitoring, evaluation, goals: Patient will tolerate adequate calories and protein to minimize weight loss.  Next visit: Tuesday, October 13 over the phone.  **Disclaimer: This note was dictated with voice recognition software. Similar sounding words can inadvertently be transcribed and this note may contain transcription errors which may not have been corrected upon publication of note.**

## 2018-12-25 ENCOUNTER — Other Ambulatory Visit: Payer: Self-pay

## 2018-12-25 ENCOUNTER — Ambulatory Visit
Admission: RE | Admit: 2018-12-25 | Discharge: 2018-12-25 | Disposition: A | Discharge status: Home / Self Care | Payer: No Typology Code available for payment source | Source: Ambulatory Visit | Attending: Radiation Oncology | Admitting: Radiation Oncology

## 2018-12-25 DIAGNOSIS — Z510 Encounter for antineoplastic radiation therapy: Secondary | ICD-10-CM | POA: Diagnosis not present

## 2018-12-26 ENCOUNTER — Other Ambulatory Visit: Payer: Self-pay

## 2018-12-26 ENCOUNTER — Ambulatory Visit
Admission: RE | Admit: 2018-12-26 | Discharge: 2018-12-26 | Disposition: A | Discharge status: Home / Self Care | Payer: No Typology Code available for payment source | Source: Ambulatory Visit | Attending: Radiation Oncology | Admitting: Radiation Oncology

## 2018-12-26 DIAGNOSIS — Z510 Encounter for antineoplastic radiation therapy: Secondary | ICD-10-CM | POA: Diagnosis not present

## 2018-12-27 ENCOUNTER — Other Ambulatory Visit: Payer: Self-pay

## 2018-12-27 ENCOUNTER — Ambulatory Visit
Admission: RE | Admit: 2018-12-27 | Discharge: 2018-12-27 | Disposition: A | Discharge status: Home / Self Care | Payer: No Typology Code available for payment source | Source: Ambulatory Visit | Attending: Radiation Oncology | Admitting: Radiation Oncology

## 2018-12-27 DIAGNOSIS — Z510 Encounter for antineoplastic radiation therapy: Secondary | ICD-10-CM | POA: Diagnosis not present

## 2018-12-30 ENCOUNTER — Other Ambulatory Visit: Payer: Self-pay

## 2018-12-30 ENCOUNTER — Other Ambulatory Visit: Payer: Self-pay | Admitting: Radiation Oncology

## 2018-12-30 ENCOUNTER — Ambulatory Visit
Admission: RE | Admit: 2018-12-30 | Discharge: 2018-12-30 | Disposition: A | Discharge status: Home / Self Care | Payer: No Typology Code available for payment source | Source: Ambulatory Visit | Attending: Radiation Oncology | Admitting: Radiation Oncology

## 2018-12-30 DIAGNOSIS — C320 Malignant neoplasm of glottis: Secondary | ICD-10-CM

## 2018-12-30 DIAGNOSIS — Z510 Encounter for antineoplastic radiation therapy: Secondary | ICD-10-CM | POA: Diagnosis not present

## 2018-12-30 MED ORDER — HYDROCODONE-ACETAMINOPHEN 7.5-325 MG/15ML PO SOLN: 10.0000 mL | Freq: Four times a day (QID) | ORAL | 0 refills | Status: DC | PRN

## 2018-12-31 ENCOUNTER — Inpatient Hospital Stay: Payer: Non-veteran care | Admitting: Nutrition

## 2018-12-31 ENCOUNTER — Ambulatory Visit
Admission: RE | Admit: 2018-12-31 | Discharge: 2018-12-31 | Disposition: A | Discharge status: Home / Self Care | Payer: No Typology Code available for payment source | Source: Ambulatory Visit | Attending: Radiation Oncology | Admitting: Radiation Oncology

## 2018-12-31 ENCOUNTER — Other Ambulatory Visit: Payer: Self-pay

## 2018-12-31 DIAGNOSIS — Z510 Encounter for antineoplastic radiation therapy: Secondary | ICD-10-CM | POA: Diagnosis not present

## 2018-12-31 NOTE — Progress Notes (Signed)
Nutrition follow-up completed with patient over the telephone.  Patient completes radiation therapy on October 19 for glottis cancer. Patient reports his weight is stable and he continues to weigh about 10 pounds more than his usual body weight. He is eating well and will occasionally drink oral nutrition supplements but does not really like them. He continues to choose softer foods.  Nutrition diagnosis: Predicted suboptimal energy intake continues.  Intervention: Provided support and encouragement for patient to continue strategies for adequate calories and protein to minimize weight loss.  Educated patient on the importance of continuing increased nutrition for at least 1 month after treatment stops to minimize weight loss. Patient would like to contact me if he has any further questions.  Monitoring, evaluation, goals: Patient will tolerate adequate calories and protein for minimal weight loss.  Next visit: Patient will contact me for any questions or concerns.  **Disclaimer: This note was dictated with voice recognition software. Similar sounding words can inadvertently be transcribed and this note may contain transcription errors which may not have been corrected upon publication of note.**

## 2019-01-01 ENCOUNTER — Ambulatory Visit
Admission: RE | Admit: 2019-01-01 | Discharge: 2019-01-01 | Disposition: A | Discharge status: Home / Self Care | Payer: No Typology Code available for payment source | Source: Ambulatory Visit | Attending: Radiation Oncology | Admitting: Radiation Oncology

## 2019-01-01 ENCOUNTER — Other Ambulatory Visit: Payer: Self-pay

## 2019-01-01 DIAGNOSIS — Z510 Encounter for antineoplastic radiation therapy: Secondary | ICD-10-CM | POA: Diagnosis not present

## 2019-01-02 ENCOUNTER — Other Ambulatory Visit: Payer: Self-pay

## 2019-01-02 ENCOUNTER — Ambulatory Visit
Admission: RE | Admit: 2019-01-02 | Discharge: 2019-01-02 | Disposition: A | Discharge status: Home / Self Care | Payer: No Typology Code available for payment source | Source: Ambulatory Visit | Attending: Radiation Oncology | Admitting: Radiation Oncology

## 2019-01-02 DIAGNOSIS — Z510 Encounter for antineoplastic radiation therapy: Secondary | ICD-10-CM | POA: Diagnosis not present

## 2019-01-03 ENCOUNTER — Other Ambulatory Visit: Payer: Self-pay

## 2019-01-03 ENCOUNTER — Ambulatory Visit
Admission: RE | Admit: 2019-01-03 | Discharge: 2019-01-03 | Disposition: A | Discharge status: Home / Self Care | Payer: No Typology Code available for payment source | Source: Ambulatory Visit | Attending: Radiation Oncology | Admitting: Radiation Oncology

## 2019-01-03 DIAGNOSIS — Z510 Encounter for antineoplastic radiation therapy: Secondary | ICD-10-CM | POA: Diagnosis not present

## 2019-01-06 ENCOUNTER — Inpatient Hospital Stay: Payer: Non-veteran care | Admitting: Nutrition

## 2019-01-06 ENCOUNTER — Encounter: Payer: Self-pay | Admitting: Radiation Oncology

## 2019-01-06 ENCOUNTER — Other Ambulatory Visit: Payer: Self-pay

## 2019-01-06 ENCOUNTER — Ambulatory Visit
Admission: RE | Admit: 2019-01-06 | Discharge: 2019-01-06 | Disposition: A | Discharge status: Home / Self Care | Payer: No Typology Code available for payment source | Source: Ambulatory Visit | Attending: Radiation Oncology | Admitting: Radiation Oncology

## 2019-01-06 DIAGNOSIS — Z510 Encounter for antineoplastic radiation therapy: Secondary | ICD-10-CM | POA: Diagnosis not present

## 2019-01-10 ENCOUNTER — Telehealth: Payer: Self-pay | Admitting: *Deleted

## 2019-01-11 NOTE — Telephone Encounter (Signed)
Oncology Nurse Navigator Documentation  Called Alicia Vazquez in follow-up to [facility] completion of XRT, s/p return call to wife. I reviewed the post-tmt guidelines given to him during PUT with Dr. Isidore Moos. He reported:  Moderate throat pain, managed with Ibuprofen.  Eating fairly well, eg Cheerios, soup, M&C, had pork medallions last evening.  Acknowledged not drinking enough fluids, "I've never liked to drink water".  Skin breakdown in tmt area, applying abx ointment.   I encouraged:  Increasing liquid intake.  Application of Sonafine to intact skin.  Execution of HEP recommended by SLP Garald Balding in order to optimize swallowing function s/p RT. He confirmed understanding of 11/6 3:20 follow-up with Dr. Isidore Moos. I encouraged him to call me with needs/concerns.  He agreed to do so.  Alicia Orem, Alicia Vazquez, Alicia Vazquez Head & Neck Oncology Nurse [place] at [place] [phone]

## 2019-01-21 NOTE — Progress Notes (Signed)
Mr. Greany presents for follow up of radiation completed 01/06/19 to his Larynx.  Pain issues, if any: Yes, in his throat at times due to eating too fast. Using a feeding tube?: No Weight changes, if any:  Wt Readings from Last 3 Encounters:  01/24/19 154 lb 12.8 oz (70.2 kg)  12/10/18 157 lb 3.2 oz (71.3 kg)   Swallowing issues, if any: He denies difficulty swallowing. He does report soreness to his throat when eating pizza or chewy foods.  Smoking or chewing tobacco? He denies.  Using fluoride trays daily?  Last ENT visit was on:  Other notable issues, if any:  He reports "gagging" at times and coughing which always finish with a sneeze. Occasionally his sputum is blood tinged.  He reports a dry mouth with reduced saliva. His skin has healed well. There is redness present  BP (!) 133/95   Pulse 74   Temp 98.7 F (37.1 C) (Tympanic)   Resp 18   Wt 154 lb 12.8 oz (70.2 kg)   SpO2 100%   BMI 24.99 kg/m

## 2019-01-22 ENCOUNTER — Telehealth: Payer: Self-pay | Admitting: *Deleted

## 2019-01-22 NOTE — Telephone Encounter (Signed)
Oncology Nurse Navigator Documentation  Rec'd call from pt's wife, Juliann Pulse.    She reported he is experiencing coughing episodes that end with gagging and a sneeze, occasionally coughs up blood-tinged sputum.  She denied he is dealing with thickened saliva.  She further noted he is not sleeping well.   I indicated coughing with blood-tinged sputum not unusual s/p EOT d/t unresolved inflammation, assured her Dr. Isidore Moos will address these and other symptoms during Friday's 2 wk post-tmt follow-up. She voiced understanding.  Dr. Isidore Moos and RN Anderson Malta Malmfelt notified.  Gayleen Orem, RN, BSN Head & Neck Oncology Nurse [place] at [place] [phone]

## 2019-01-24 ENCOUNTER — Encounter: Payer: Self-pay | Admitting: Radiation Oncology

## 2019-01-24 ENCOUNTER — Ambulatory Visit
Admission: RE | Admit: 2019-01-24 | Discharge: 2019-01-24 | Disposition: A | Discharge status: Home / Self Care | Payer: No Typology Code available for payment source | Source: Ambulatory Visit | Attending: Radiation Oncology | Admitting: Radiation Oncology

## 2019-01-24 VITALS — BP 133/95 | HR 74 | Temp 98.7°F | Resp 18 | Wt 154.8 lb

## 2019-01-24 DIAGNOSIS — R05 Cough: Secondary | ICD-10-CM | POA: Diagnosis not present

## 2019-01-24 DIAGNOSIS — Z79899 Other long term (current) drug therapy: Secondary | ICD-10-CM | POA: Diagnosis not present

## 2019-01-24 DIAGNOSIS — Z923 Personal history of irradiation: Secondary | ICD-10-CM | POA: Diagnosis not present

## 2019-01-24 DIAGNOSIS — R682 Dry mouth, unspecified: Secondary | ICD-10-CM | POA: Insufficient documentation

## 2019-01-24 DIAGNOSIS — C320 Malignant neoplasm of glottis: Secondary | ICD-10-CM | POA: Diagnosis not present

## 2019-01-24 NOTE — Progress Notes (Signed)
Radiation Oncology         [phone] ________________________________  Name: Alicia Vazquez MRN: [id number]  Date: 01/24/2019  DOB: 1956/02/06  Follow-Up Visit Note  CC: Susy Frizzle, MD  Berneice Heinrich, MD  Diagnosis and Prior Radiotherapy:       ICD-10-CM   1. 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    Pain issues, if any: Yes, in his throat at times due to eating too fast.  Using a feeding tube?: No  Weight changes, if any:  Wt Readings from Last 3 Encounters:  01/24/19 154 lb 12.8 oz (70.2 kg)  12/10/18 157 lb 3.2 oz (71.3 kg)   Swallowing issues, if any: He denies difficulty swallowing. He does report soreness to his throat when eating pizza or chewy foods.   Smoking or chewing tobacco? He denies.   Last ENT visit was on: not since diagnosis  Other notable issues, if any: He reports "gagging" at times and coughing which always finish with a sneeze. Occasionally his sputum is blood tinged. He also reports a dry mouth with reduced saliva.  Overall he feels like he is getting better.  His voice is improving.  He is playing golf frequently.   ALLERGIES:  has No Known Allergies.  Meds: Current Outpatient Medications  Medication Sig Dispense Refill  . cetirizine (ZYRTEC) 10 MG tablet Take 10 mg by mouth daily.    . fluticasone (FLONASE) 50 MCG/ACT nasal spray Place into both nostrils daily.    Marland Kitchen omeprazole (PRILOSEC) 40 MG capsule Take 40 mg by mouth 2 (two) times daily.     Marland Kitchen HYDROcodone-acetaminophen (HYCET) 7.5-325 mg/15 ml solution Take 10-15 mLs by mouth 4 (four) times daily as needed for moderate pain. Take with food. (Patient not taking: Reported on 01/24/2019) 473 mL 0  . lidocaine (XYLOCAINE) 2 % solution Patient: Mix 1part 2% viscous lidocaine, 1part H20. Swallow 58mL of diluted mixture, 4min before meals and at bedtime, up to  QID (Patient not taking: Reported on 01/24/2019) 100 mL 5  . LORazepam (ATIVAN) 0.5 MG tablet Take 1 tablet 30 minutes before radiotherapy as needed anxiety. (Patient not taking: Reported on 01/24/2019) 13 tablet 0  . nicotine (NICODERM CQ - DOSED IN MG/24 HOURS) 14 mg/24hr patch Place 14 mg onto the skin daily.    . nicotine (NICODERM CQ - DOSED IN MG/24 HOURS) 21 mg/24hr patch Place 21 mg onto the skin daily.    . nicotine (NICODERM CQ - DOSED IN MG/24 HR) 7 mg/24hr patch Place 7 mg onto the skin daily.    . sucralfate (CARAFATE) 1 g tablet Dissolve 1 tablet in 10 mL H20 and swallow 30 min prior to meals and bedtime. (Patient not taking: Reported on 01/24/2019) 40 tablet 5   No current facility-administered medications for this encounter.     Physical Findings: The patient is in no acute distress. Patient is alert and oriented. Wt Readings from Last 3 Encounters:  01/24/19 154 lb 12.8 oz (70.2 kg)  12/10/18 157 lb 3.2 oz (71.3 kg)    weight is 154 lb 12.8 oz (70.2 kg). His tympanic temperature is 98.7 F (37.1 C). His blood pressure is 133/95 (abnormal) and his pulse is 74. His respiration is 18 and oxygen saturation is 100%. .  General: Alert and oriented, in no acute distress HEENT: Head is normocephalic. Extraocular  movements are intact. Oropharynx is notable for no lesions Neck: Neck is notable for healing skin with a little pinkness.  Skin is intact  Psychiatric: Judgment and insight are intact. Affect is appropriate.   Lab Findings: No results found for: WBC, HGB, HCT, MCV, PLT  Lab Results  Component Value Date   TSH 2.793 11/26/2018    Radiographic Findings: No results found.  Impression/Plan:    1) Head and Neck Cancer Status: Healing from radiotherapy  2) Nutritional Status: No issues PEG tube: None  3) Risk Factors: The patient has been educated about risk factors including alcohol and tobacco abuse; they understand that avoidance of alcohol and tobacco is  important to prevent recurrences as well as other cancers  4) Swallowing: Functional  5) Thyroid function: Will recheck in July Lab Results  Component Value Date   TSH 2.793 11/26/2018    6) Other: Advised him to continue moisturizing the skin and wear sunscreen when outdoors over his neck  7) Follow-up in July with TSH.  Due to the pandemic and lack of PPE in our department he will need to get his laryngoscopies with his otolaryngologist until then.  He has been scheduled for January with Dr. Edison Nasuti who is very conscientious in following her patients. The patient was encouraged to call with any issues or questions before then.  _____________________________________   Eppie Gibson, MD  This document serves as a record of services personally performed by Eppie Gibson, MD. It was created on her behalf by Wilburn Mylar, a trained medical scribe. The creation of this record is based on the scribe's personal observations and the provider's statements to them. This document has been checked and approved by the attending provider.

## 2019-01-24 NOTE — Progress Notes (Signed)
  Patient Name: Alicia Vazquez MRN: [id number] DOB: 14-Nov-1955 Referring Physician: Silvestre Moment (Profile Not Attached) Date of Service: 01/06/2019 [place], [place]                                                        End Of Treatment Note  Diagnoses: C32.0-Malignant neoplasm of glottis   Cancer Staging: Stage II (cT2, cN0, cM0)  Intent: Curative  Radiation Treatment Dates: 11/27/2018 through 01/06/2019  Site Technique Total Dose (Gy) Dose per Fx (Gy) Completed Fx Beam Energies  Head & neck: HN_larynx 3D 65.25/65.25 2.25 29/29 6X   Narrative: The patient tolerated radiation therapy relatively well. He reported sore throat with painful swallowing, thick sputum, and mild fatigue. His voice remained hoarse throughout treatment. He denied any taste changes, shortness of breath, and mouth sores. By the end of treatment, he was noted to have peeling desquamation at the anterior neck.  Plan: The patient will follow-up with radiation oncology in 2 weeks.  ________________________________________________   Eppie Gibson, MD  This document serves as a record of services personally performed by Eppie Gibson, MD. It was created on her behalf by Wilburn Mylar, a trained medical scribe. The creation of this record is based on the scribe's personal observations and the provider's statements to them. This document has been checked and approved by the attending provider.

## 2019-01-27 ENCOUNTER — Telehealth: Payer: Self-pay | Admitting: *Deleted

## 2019-01-27 ENCOUNTER — Telehealth: Payer: Self-pay | Admitting: Radiation Oncology

## 2019-01-27 NOTE — Telephone Encounter (Signed)
CALLED PATIENT TO INFORM OF LAB AND FU ON 10/03/19 (1:30 PM LAB) AND 2 PM - FU WITH DR. Isidore Moos, NO ANSWER, UNABLE TO LEAVE MESSAGE DUE TO HAVING NO VM SET UP

## 2019-01-28 ENCOUNTER — Inpatient Hospital Stay: Payer: Non-veteran care | Attending: Radiation Oncology | Admitting: Nutrition

## 2019-01-28 NOTE — Progress Notes (Signed)
Contacted patient by telephone for nutrition follow up. He reports he feels well. He states he weighs about 150 pounds on his home scale. Denies difficulty eating. Reports he tried to drink a beer and it didn't taste good. He is playing golf whenever he can. Patient educated to continue strategies for adequate calories and protein for weight maintenance. Continue daily exercise as allowed by MD. Discouraged alcohol intake. Patient to contact me with questions. No follow up scheduled.

## 2019-09-25 ENCOUNTER — Telehealth: Payer: Self-pay | Admitting: *Deleted

## 2019-09-25 NOTE — Progress Notes (Addendum)
Alicia Vazquez presents today for follow up of radiation completed on 01/06/2019 to his larynx.   Pain issues, if any: Patient denies Using a feeding tube?: N/A Weight changes, if any:  Wt Readings from Last 3 Encounters:  09/26/19 160 lb 9.6 oz (72.8 kg)  01/24/19 154 lb 12.8 oz (70.2 kg)  12/10/18 157 lb 3.2 oz (71.3 kg)   Swallowing issues, if any: Patient denies--eating and drinking well Smoking or chewing tobacco? None Using fluoride trays daily? Yes Last ENT visit was on: Saw Dr. Loren Racer today at [facility] and had a fiberoptic laryngoscopy Other notable issues, if any: Patient denies any concerns. Recently returned from a cruise and reports he is doing great  Vitals:   09/26/19 1507  BP: (!) 146/93  Pulse: 73  Resp: 18  Temp: 98.1 F (36.7 C)  SpO2: 99%

## 2019-09-25 NOTE — Telephone Encounter (Signed)
CALLED PATIENT TO REMIND OF LAB AND FU ON 09-26-19, LVM FOR A RETURN CALL

## 2019-09-26 ENCOUNTER — Ambulatory Visit
Admission: RE | Admit: 2019-09-26 | Discharge: 2019-09-26 | Disposition: A | Discharge status: Home / Self Care | Payer: No Typology Code available for payment source | Source: Ambulatory Visit | Attending: Radiation Oncology | Admitting: Radiation Oncology

## 2019-09-26 ENCOUNTER — Other Ambulatory Visit: Payer: Self-pay

## 2019-09-26 VITALS — BP 146/93 | HR 73 | Temp 98.1°F | Resp 18 | Ht 66.0 in | Wt 160.6 lb

## 2019-09-26 DIAGNOSIS — Z510 Encounter for antineoplastic radiation therapy: Secondary | ICD-10-CM | POA: Insufficient documentation

## 2019-09-26 DIAGNOSIS — C320 Malignant neoplasm of glottis: Secondary | ICD-10-CM | POA: Insufficient documentation

## 2019-09-29 ENCOUNTER — Telehealth: Payer: Self-pay

## 2019-09-29 LAB — TSH: TSH: 3.06 u[IU]/mL (ref 0.308–3.960)

## 2019-09-29 NOTE — Telephone Encounter (Signed)
-----   Message from Eppie Gibson, MD sent at 09/29/2019 11:54 AM EDT ----- Please let pt and wife know that TSH looks good.  Thanks! Sarah ----- Message ----- From: Buel Ream, Lab In [place] Sent: 09/29/2019   8:54 AM EDT To: Eppie Gibson, MD

## 2019-09-29 NOTE — Telephone Encounter (Signed)
Called patient to let him know that per Dr. Isidore Moos patient's TSH level is normal. Patient verbalized understanding and appreciation for call, and stated he would update his wife with the news. No other needs/concerns identified.

## 2019-09-30 NOTE — Progress Notes (Addendum)
Radiation Oncology         (336) [phone] ________________________________  Name: Alicia Vazquez MRN: [id number]  Date: 09/26/2019  DOB: [birth year]  Follow-Up Visit Note  CC: Alicia Frizzle, MD  Berneice Heinrich, MD  Diagnosis and Prior Radiotherapy:       ICD-10-CM   1. Glottis carcinoma ([place])  C32.0    Cancer Staging Glottis carcinoma ([place]) Staging form: Larynx - Glottis, AJCC 8th Edition - Clinical stage from 11/18/2018: Stage II (cT2, cN0, cM0) - Signed by Eppie Gibson, MD on 11/19/2018   Radiation Treatment Dates: 11/27/2018 through 01/06/2019  Site Technique Total Dose (Gy) Dose per Fx (Gy) Completed Fx Beam Energies  Head & neck: HN_larynx 3D 65.25/65.25 2.25 29/29 6X   CHIEF COMPLAINT:  Here for follow-up and surveillance of glottic cancer  Narrative:    Alicia Vazquez presents today for follow up of radiation completed on 01/06/2019 to his larynx.   Pain issues, if any: Patient denies Using a feeding tube?: N/A Weight changes, if any:  Wt Readings from Last 3 Encounters:  09/26/19 160 lb 9.6 oz (72.8 kg)  01/24/19 154 lb 12.8 oz (70.2 kg)  12/10/18 157 lb 3.2 oz (71.3 kg)   Swallowing issues, if any: Patient denies--eating and drinking well Smoking or chewing tobacco? None Using fluoride trays daily? Yes Last ENT visit was on: Saw Dr. Loren Racer today at [facility] and had a fiberoptic laryngoscopy -he showed me the photographs that revealed no evidence of disease Other notable issues, if any: Patient denies any concerns. Recently returned from a cruise and reports he is doing great.  He has been Vaccinated for Covid 19.   ALLERGIES:  has No Known Allergies.  Meds: Current Outpatient Medications  Medication Sig Dispense Refill  . cetirizine (ZYRTEC) 10 MG tablet Take 10 mg by mouth daily.    . fluticasone (FLONASE) 50 MCG/ACT nasal spray Place into both nostrils daily.    Marland Kitchen omeprazole (PRILOSEC) 40 MG capsule Take 40 mg by mouth 2 (two) times daily.      No current  facility-administered medications for this encounter.    Physical Findings: The patient is in no acute distress. Patient is alert and oriented. Wt Readings from Last 3 Encounters:  09/26/19 160 lb 9.6 oz (72.8 kg)  01/24/19 154 lb 12.8 oz (70.2 kg)  12/10/18 157 lb 3.2 oz (71.3 kg)    height is 5\' 6"  (1.676 m) and weight is 160 lb 9.6 oz (72.8 kg). His oral temperature is 98.1 F (36.7 C). His blood pressure is 146/93 (abnormal) and his pulse is 73. His respiration is 18 and oxygen saturation is 99%. .  General: Alert and oriented, in no acute distress HEENT: Head is normocephalic. Extraocular movements are intact. Oropharynx is notable for no lesions Neck: Skin has healed well and skin is intact over neck; no palpable adenopathy Psychiatric: Judgment and insight are intact. Affect is appropriate.   Lab Findings: No results found for: WBC, HGB, HCT, MCV, PLT  Lab Results  Component Value Date   TSH 3.060 09/26/2019    Radiographic Findings: No results found.  Impression/Plan:    1) Head and Neck Cancer Status: NED  2) Nutritional Status: No issues PEG tube: None  3) Risk Factors: The patient has been educated about risk factors including vaping and tobacco abuse; they understand that avoidance of smoking and vaping are important to prevent recurrences as well as other cancers; he has quit smoking completely and denies vaping  4) Swallowing: Functional  5) Thyroid function: Within normal limits today Lab Results  Component Value Date   TSH 3.060 09/26/2019    6) Other: I will see him back in 3 months for follow-up with laryngoscopy at that time.  On date of service, in total, I spent 20 minutes on this encounter. Patient was seen in person.    _____________________________________   Eppie Gibson, MD

## 2019-10-01 ENCOUNTER — Encounter: Payer: Self-pay | Admitting: Radiation Oncology

## 2019-10-03 ENCOUNTER — Ambulatory Visit: Payer: No Typology Code available for payment source

## 2019-10-03 ENCOUNTER — Ambulatory Visit: Payer: Self-pay | Admitting: Radiation Oncology

## 2019-12-31 ENCOUNTER — Ambulatory Visit: Payer: Non-veteran care | Admitting: Radiation Oncology

## 2020-01-09 ENCOUNTER — Telehealth: Payer: Self-pay | Admitting: *Deleted

## 2020-01-09 NOTE — Telephone Encounter (Signed)
CALLED PATIENT TO ASK ABOUT RESCHEDULING FU APPT. FROM 12-31-19, RESCHEDULED FOR 02-06-20 @ 11:20 AM, SPOKE WITH PATIENT AND HE IS AWARE OF THIS APPT.

## 2020-01-28 ENCOUNTER — Ambulatory Visit
Admission: RE | Admit: 2020-01-28 | Discharge: 2020-01-28 | Disposition: A | Discharge status: Home / Self Care | Payer: Self-pay | Source: Ambulatory Visit | Attending: Radiation Oncology | Admitting: Radiation Oncology

## 2020-01-28 ENCOUNTER — Other Ambulatory Visit: Payer: Self-pay | Admitting: Radiation Oncology

## 2020-01-28 DIAGNOSIS — C320 Malignant neoplasm of glottis: Secondary | ICD-10-CM

## 2020-02-06 ENCOUNTER — Ambulatory Visit
Admission: RE | Admit: 2020-02-06 | Discharge: 2020-02-06 | Disposition: A | Discharge status: Home / Self Care | Payer: No Typology Code available for payment source | Source: Ambulatory Visit | Attending: Radiation Oncology | Admitting: Radiation Oncology

## 2020-02-06 ENCOUNTER — Other Ambulatory Visit: Payer: Self-pay

## 2020-02-06 ENCOUNTER — Encounter: Payer: Self-pay | Admitting: Radiation Oncology

## 2020-02-06 DIAGNOSIS — Z923 Personal history of irradiation: Secondary | ICD-10-CM | POA: Diagnosis not present

## 2020-02-06 DIAGNOSIS — Z8521 Personal history of malignant neoplasm of larynx: Secondary | ICD-10-CM | POA: Insufficient documentation

## 2020-02-06 DIAGNOSIS — C320 Malignant neoplasm of glottis: Secondary | ICD-10-CM

## 2020-02-06 MED ORDER — LARYNGOSCOPY SOLUTION RAD-ONC
15.0000 mL | Freq: Once | TOPICAL | Status: AC
  Administered 2020-02-06: 15 mL via TOPICAL
  Filled 2020-02-06: qty 15

## 2020-02-06 NOTE — Progress Notes (Signed)
Radiation Oncology         [phone] ________________________________  Name: Alicia Vazquez MRN: [id number]  Date: 02/06/2020  DOB: 09-14-1955  Follow-Up Visit Note  CC: Susy Frizzle, MD  Berneice Heinrich, MD  Diagnosis and Prior Radiotherapy:       ICD-10-CM   1. Glottis carcinoma ([facility])  C32.0 laryngocopy solution for Rad-Onc    Fiberoptic laryngoscopy   Cancer Staging Glottis carcinoma ([place]) Staging form: Larynx - Glottis, AJCC 8th Edition - Clinical stage from 11/18/2018: Stage II (cT2, cN0, cM0) - Signed by Eppie Gibson, MD on 11/19/2018   Radiation Treatment Dates: 11/27/2018 through 01/06/2019  Site Technique Total Dose (Gy) Dose per Fx (Gy) Completed Fx Beam Energies  Head & neck: HN_larynx 3D 65.25/65.25 2.25 29/29 6X   CHIEF COMPLAINT:  Here for follow-up and surveillance of glottic cancer  Narrative:      Mr. Mcmeekin presents today for follow up of radiation completed on 01/06/2019 to his larynx.   Pain issues, if any: no Using a feeding tube?: N/A Weight changes, if any: gained 2.3 lbs Swallowing issues, if any: no Smoking or chewing tobacco?  no Using fluoride trays daily?  Last ENT visit was on: Dr. Loren Racer with  09/2019 Other notable issues, if any: none  He has done a lot of traveling this year including going to [place] and [place].  He is enjoying his retirement.  BP (!) 137/91 (BP Location: Left Arm, Patient Position: Sitting)   Pulse 92   Temp 97.9 F (36.6 C) (Temporal)   Resp 18   Ht 5\' 6"  (1.676 m)   Wt 162 lb 6 oz (73.7 kg)   SpO2 99%   BMI 26.21 kg/m   Wt Readings from Last 3 Encounters:  02/06/20 162 lb 6 oz (73.7 kg)  09/26/19 160 lb 9.6 oz (72.8 kg)  01/24/19 154 lb 12.8 oz (70.2 kg)    ALLERGIES:  has No Known Allergies.  Meds: Current Outpatient Medications  Medication Sig Dispense Refill  . cetirizine (ZYRTEC) 10 MG tablet Take 10 mg by mouth daily.    . fluticasone (FLONASE) 50 MCG/ACT nasal spray Place into both  nostrils daily.    Marland Kitchen omeprazole (PRILOSEC) 40 MG capsule Take 40 mg by mouth 2 (two) times daily.      No current facility-administered medications for this encounter.    Physical Findings: The patient is in no acute distress. Patient is alert and oriented. Wt Readings from Last 3 Encounters:  02/06/20 162 lb 6 oz (73.7 kg)  09/26/19 160 lb 9.6 oz (72.8 kg)  01/24/19 154 lb 12.8 oz (70.2 kg)    height is 5\' 6"  (1.676 m) and weight is 162 lb 6 oz (73.7 kg). His temporal temperature is 97.9 F (36.6 C). His blood pressure is 137/91 (abnormal) and his pulse is 92. His respiration is 18 and oxygen saturation is 99%. .  General: Alert and oriented, in no acute distress HEENT: Head is normocephalic. Extraocular movements are intact. Oropharynx is notable for no lesions Neck: Skin has healed well and skin is intact over neck; no palpable adenopathy Psychiatric: Judgment and insight are intact. Affect is appropriate. Heart regular in rate and rhythm Chest clear to auscultation bilaterally  PROCEDURE NOTE: After obtaining consent and anesthetizing the nasal cavity with topical lidocaine and phenylephrine, the flexible endoscope was introduced and passed through the nasal cavity.  No lesions seen in the nasopharynx, pharynx, hypopharynx, or supraglottic larynx.  The true cords  are symmetrically mobile without any nodules.    Lab Findings: No results found for: WBC, HGB, HCT, MCV, PLT  Lab Results  Component Value Date   TSH 3.060 09/26/2019    Radiographic Findings: No results found.  Impression/Plan:    1) Head and Neck Cancer Status: NED  2) Nutritional Status: No issues PEG tube: None  3) Risk Factors: The patient has been educated about risk factors including vaping and tobacco abuse; they understand that avoidance of smoking and vaping are important to prevent recurrences as well as other cancers; he has quit smoking completely and denies vaping  4) Swallowing:  Functional  5) Thyroid function: Within normal limits - continue to check annually Lab Results  Component Value Date   TSH 3.060 09/26/2019    6) Other: I will see him back in early June 2022 follow-up with laryngoscopy at that time.  7) We discussed measures to reduce the risk of infection during the COVID-19 pandemic.  He is eligible for the booster shot.  He plans to get this at the [place] [facility] soon.  On date of service, in total, I spent 30 minutes on this encounter. Patient was seen in person.   _____________________________________   Eppie Gibson, MD

## 2020-02-06 NOTE — Progress Notes (Signed)
Mr. Mally presents today for follow up of radiation completed on 01/06/2019 to his larynx.   Pain issues, if any: no Using a feeding tube?: N/A Weight changes, if any: gained 2.3 lbs Swallowing issues, if any: no Smoking or chewing tobacco?  no Using fluoride trays daily?  Last ENT visit was on: Dr. Loren Racer with  09/2019 Other notable issues, if any: none  BP (!) 137/91 (BP Location: Left Arm, Patient Position: Sitting)   Pulse 92   Temp 97.9 F (36.6 C) (Temporal)   Resp 18   Ht 5\' 6"  (1.676 m)   Wt 162 lb 6 oz (73.7 kg)   SpO2 99%   BMI 26.21 kg/m   Wt Readings from Last 3 Encounters:  02/06/20 162 lb 6 oz (73.7 kg)  09/26/19 160 lb 9.6 oz (72.8 kg)  01/24/19 154 lb 12.8 oz (70.2 kg)

## 2020-08-18 ENCOUNTER — Telehealth: Payer: Self-pay | Admitting: Radiation Oncology

## 2020-08-18 NOTE — Telephone Encounter (Signed)
Pt wife called stating that they are out of the country and needed to cancel pt f/u appt with Dr. Isidore Moos on 6/3. They stated that they will call us when they are back home to reschedule this appt. Note sent to Dr. Isidore Moos and nurse Althia Forts.

## 2020-08-20 ENCOUNTER — Ambulatory Visit: Payer: Non-veteran care | Admitting: Radiation Oncology

## 2020-11-29 IMAGING — CT NUCLEAR MEDICINE PET IMAGE INITIAL (PI) SKULL BASE TO THIGH
1 of 8 series · 3 of 16 positions shown, 4 images · non-contrast
Comparison: Outside CT neck 10/11/2018.

CLINICAL DATA: Initial treatment strategy for residual cancer.

EXAM:
NUCLEAR MEDICINE PET SKULL BASE TO THIGH
TECHNIQUE: 7.2 mCi F-18 FDG was injected intravenously. Full-ring PET imaging
was performed from the skull base to thigh after the radiotracer. CT
data was obtained and used for attenuation correction and anatomic
localization.
Fasting blood glucose: 95 mg/dl

[Series 4: ct hn_sk_th 5.0 b31f · axial · 0.98mm/px · z∈[+100,+1012]mm · 3 of 229 slices shown, 4 images]
[im 1/229  soft-tissue]
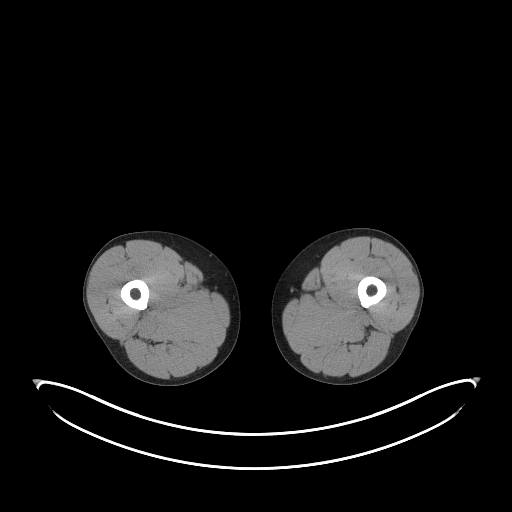
[im 1/229  bone]
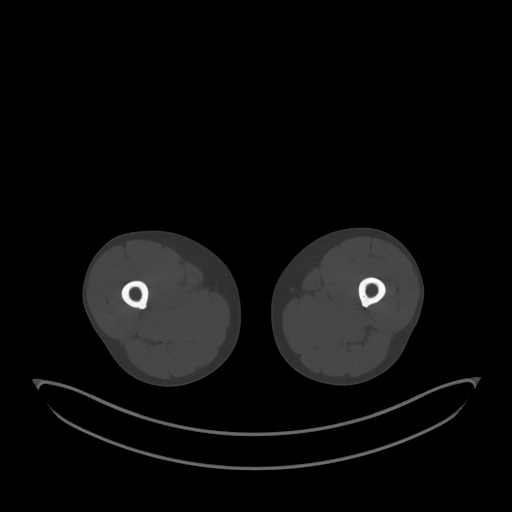
[im 115/229  soft-tissue]
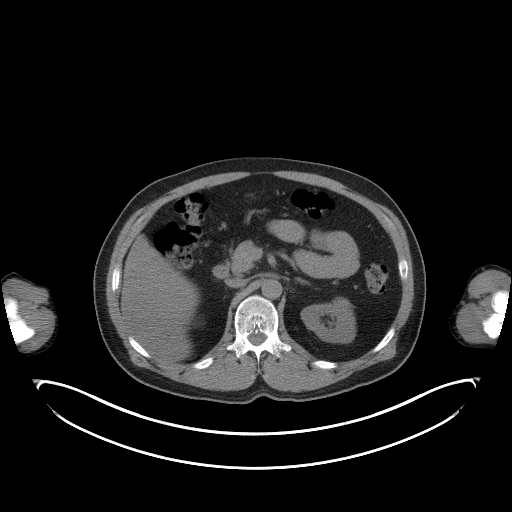
[im 229/229  soft-tissue]
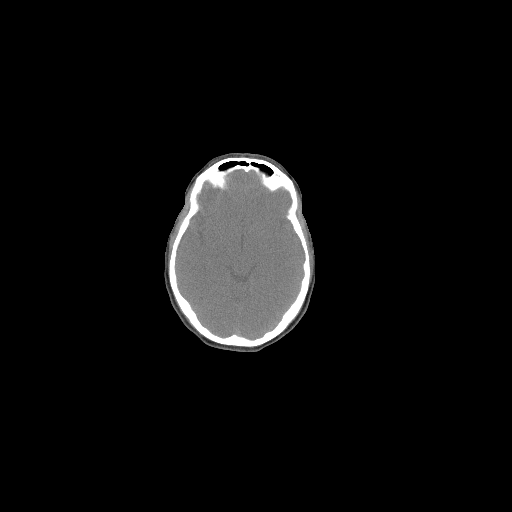

[3 of 16 positions shown; findings below may reference images not displayed]

FINDINGS: Mediastinal blood pool activity: SUV max

Liver activity: SUV max NA

NECK: No hypermetabolic lymph nodes. Uptake within the oropharynx
appears symmetric.

Incidental CT findings: None.

CHEST: No hypermetabolic mediastinal hilar or axillary nodes. No
hypermetabolic pulmonary nodules.

Incidental CT findings: Heart is mildly enlarged. No pericardial or
pleural effusion. There may be distal esophageal wall thickening
which can be seen with gastroesophageal reflux.

ABDOMEN/PELVIS: No abnormal hypermetabolism in the liver, adrenal
glands, spleen or pancreas. No hypermetabolic lymph nodes.

Incidental CT findings: Liver may be slightly decreased in
attenuation diffusely. A stone is seen in the gallbladder. Adrenal
glands, kidneys, spleen, pancreas, stomach and bowel are
unremarkable. No free fluid.

SKELETON: No abnormal osseous hypermetabolism.

Incidental CT findings: None.
IMPRESSION: 1. No evidence of a hypermetabolic primary lesion or adenopathy in
the neck.
2. Hepatic steatosis.
3. Cholelithiasis.

## 2024-04-02 ENCOUNTER — Encounter (INDEPENDENT_AMBULATORY_CARE_PROVIDER_SITE_OTHER): Payer: Self-pay | Admitting: Otolaryngology

## 2024-04-02 ENCOUNTER — Ambulatory Visit (INDEPENDENT_AMBULATORY_CARE_PROVIDER_SITE_OTHER): Admitting: Otolaryngology

## 2024-04-02 VITALS — BP 144/89 | HR 91 | Temp 97.7°F | Ht 66.0 in | Wt 157.0 lb

## 2024-04-02 DIAGNOSIS — Z8521 Personal history of malignant neoplasm of larynx: Secondary | ICD-10-CM

## 2024-04-02 DIAGNOSIS — K219 Gastro-esophageal reflux disease without esophagitis: Secondary | ICD-10-CM

## 2024-04-02 DIAGNOSIS — R053 Chronic cough: Secondary | ICD-10-CM | POA: Diagnosis not present

## 2024-04-02 MED ORDER — PANTOPRAZOLE SODIUM 40 MG PO TBEC: 40.0000 mg | DELAYED_RELEASE_TABLET | Freq: Two times a day (BID) | ORAL | 1 refills | Status: DC

## 2024-04-02 MED ORDER — PANTOPRAZOLE SODIUM 40 MG PO TBEC: 40.0000 mg | DELAYED_RELEASE_TABLET | Freq: Two times a day (BID) | ORAL | 1 refills | Status: AC

## 2024-04-02 NOTE — Progress Notes (Signed)
 Reason for Consult: History of cancer Referring Physician: Dr. Robbi Lang Lytle [name] is an 68 y.o. adult.  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  He was treated with radiation.  He completed in 52.  He now has been doing well.  Been followed at the [facility].  He has had good postradiation checks.  He has no dysphagia or odynophagia.  He does have a cough that is dry and been longstanding perhaps even prior to the radiation.  He has reflux issues and takes once a day omeprazole.  He has no pain in the throat.  His voice is unchanged for several years.  Past Medical History:  Diagnosis Date   Allergic rhinitis    Allergy    Cancer (HCC)    malignant neoplasm of larynx dx 10/2018   Low back pain    Nicotine dependence     No past surgical history on file.  No family history on file.  Social History:  reports that he has quit smoking. He quit smokeless tobacco use about 6 years ago. He reports that he does not currently use alcohol. He reports that he does not use drugs.  Allergies: Allergies[1]   No results found for this or any previous visit (from the past 48 hours).  No results found.  ROS There were no vitals taken for this visit. Physical Exam Constitutional:      Appearance: Normal appearance.  HENT:     Head: Normocephalic and atraumatic.     Right Ear: Tympanic membrane is without lesions and middle ear aerated, ear canal and external ear normal.     Left Ear: Tympanic membrane is without lesions and middle ear aerated, ear canal and external ear normal.     Nose: Nose without deviation of septum.  Turbinates with mild hypertrophy, No significant swelling or masses.     Oral cavity/oropharynx: Mucous membranes are moist. No lesions or masses    Larynx: normal voice. Mirror attempted without success    Eyes:     Extraocular Movements: Extraocular movements intact.     Conjunctiva/sclera: Conjunctivae normal.     Pupils:  Pupils are equal, round, and reactive to light.  Cardiovascular:     Rate and Rhythm: Normal rate.  Pulmonary:     Effort: Pulmonary effort is normal.  Musculoskeletal:     Cervical back: Normal range of motion and neck supple. No rigidity.  Lymphadenopathy:     Cervical: No cervical adenopathy or masses.salivary glands without lesions. .     Salivary glands- no mass or swelling Neurological:     Mental Status: He is alert. CN 2-12 intact. No nystagmus   Flexible fibroptic laryngoscopy  Patient was informed of risks, benefits, and options. All questions answered. Consent obtained.   The scope was passed through the nose and tracked into the nasopharynx. The nasopharynx without lesions or masses. The scope was positioned over the base of tongue and epiglottis. There was no obvious lesions or significant swelling and any of the laryngeal or pharyngeal structures. The vocal cords move well.  No evidence of recurrence the subglottis has minimal visualization but no lesions identified. The scope was removed without difficulty and patient tolerated well.     Assessment/Plan: Squamous of carcinoma of the glottis-this seems to be doing well.  He is now beyond 5 years so technically he does not need to be followed anymore.  We discussed follow-up and he will come  back in a year if he so desires.  He has no evidence of recurrence by fiberoptic exam or symptoms  Cough-I suspect this is secondary to multiple issues but reflux certainly is a possibility.  Switching him to Protonix  twice daily for a short timeframe may be helpful in testing whether his cough can be improved.  He would like to do that and we will call in the Protonix .  Norleen Notice 04/02/2024, 8:06 AM         [1] No Known Allergies

## 2024-04-02 NOTE — Addendum Note (Signed)
 Addended by: ROARK NORLEEN HERO on: 04/02/2024 11:45 AM   Modules accepted: Orders

## 2024-04-02 NOTE — Addendum Note (Signed)
 Addended by: ROARK NORLEEN HERO on: 04/02/2024 11:49 AM   Modules accepted: Orders
